# Patient Record
Sex: Male | Born: 1985 | Hispanic: Yes | Marital: Single | Smoking: Current every day smoker
Health system: Southern US, Community
[De-identification: ages and names within clinical notes are randomized; demographics above are authoritative.]

---
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
## 2020-07-04 ENCOUNTER — Other Ambulatory Visit: Payer: Self-pay

## 2020-07-04 ENCOUNTER — Emergency Department (HOSPITAL_BASED_OUTPATIENT_CLINIC_OR_DEPARTMENT_OTHER): Payer: Self-pay

## 2020-07-04 ENCOUNTER — Emergency Department (HOSPITAL_BASED_OUTPATIENT_CLINIC_OR_DEPARTMENT_OTHER)
Admission: EM | Admit: 2020-07-04 | Discharge: 2020-07-04 | Disposition: A | Discharge status: Home / Self Care | Payer: Self-pay | Attending: Emergency Medicine | Admitting: Emergency Medicine

## 2020-07-04 ENCOUNTER — Other Ambulatory Visit (HOSPITAL_BASED_OUTPATIENT_CLINIC_OR_DEPARTMENT_OTHER): Payer: Self-pay

## 2020-07-04 ENCOUNTER — Encounter (HOSPITAL_BASED_OUTPATIENT_CLINIC_OR_DEPARTMENT_OTHER): Payer: Self-pay

## 2020-07-04 DIAGNOSIS — R059 Cough, unspecified: Secondary | ICD-10-CM

## 2020-07-04 DIAGNOSIS — F17210 Nicotine dependence, cigarettes, uncomplicated: Secondary | ICD-10-CM | POA: Insufficient documentation

## 2020-07-04 DIAGNOSIS — R042 Hemoptysis: Secondary | ICD-10-CM | POA: Insufficient documentation

## 2020-07-04 DIAGNOSIS — S8012XA Contusion of left lower leg, initial encounter: Secondary | ICD-10-CM | POA: Insufficient documentation

## 2020-07-04 DIAGNOSIS — X58XXXA Exposure to other specified factors, initial encounter: Secondary | ICD-10-CM | POA: Insufficient documentation

## 2020-07-04 DIAGNOSIS — Z20822 Contact with and (suspected) exposure to covid-19: Secondary | ICD-10-CM | POA: Insufficient documentation

## 2020-07-04 DIAGNOSIS — S5011XA Contusion of right forearm, initial encounter: Secondary | ICD-10-CM | POA: Insufficient documentation

## 2020-07-04 DIAGNOSIS — D539 Nutritional anemia, unspecified: Secondary | ICD-10-CM | POA: Insufficient documentation

## 2020-07-04 DIAGNOSIS — R040 Epistaxis: Secondary | ICD-10-CM | POA: Insufficient documentation

## 2020-07-04 LAB — CBC WITH DIFFERENTIAL/PLATELET
Abs Immature Granulocytes: 0.05 10*3/uL (ref 0.00–0.07)
Basophils Absolute: 0.2 10*3/uL — ABNORMAL HIGH (ref 0.0–0.1)
Basophils Relative: 1 %
Eosinophils Absolute: 0.4 10*3/uL (ref 0.0–0.5)
Eosinophils Relative: 3 %
HCT: 35.8 % — ABNORMAL LOW (ref 39.0–52.0)
Hemoglobin: 11.8 g/dL — ABNORMAL LOW (ref 13.0–17.0)
Immature Granulocytes: 1 %
Lymphocytes Relative: 27 %
Lymphs Abs: 2.8 10*3/uL (ref 0.7–4.0)
MCH: 34.5 pg — ABNORMAL HIGH (ref 26.0–34.0)
MCHC: 33 g/dL (ref 30.0–36.0)
MCV: 104.7 fL — ABNORMAL HIGH (ref 80.0–100.0)
Monocytes Absolute: 1.1 10*3/uL — ABNORMAL HIGH (ref 0.1–1.0)
Monocytes Relative: 11 %
Neutro Abs: 6 10*3/uL (ref 1.7–7.7)
Neutrophils Relative %: 57 %
Platelets: 210 10*3/uL (ref 150–400)
RBC: 3.42 MIL/uL — ABNORMAL LOW (ref 4.22–5.81)
RDW: 14.6 % (ref 11.5–15.5)
WBC: 10.4 10*3/uL (ref 4.0–10.5)
nRBC: 0 % (ref 0.0–0.2)

## 2020-07-04 LAB — PROTIME-INR
INR: 1.1 (ref 0.8–1.2)
Prothrombin Time: 14 seconds (ref 11.4–15.2)

## 2020-07-04 LAB — COMPREHENSIVE METABOLIC PANEL
ALT: 64 U/L — ABNORMAL HIGH (ref 0–44)
AST: 220 U/L — ABNORMAL HIGH (ref 15–41)
Albumin: 3.6 g/dL (ref 3.5–5.0)
Alkaline Phosphatase: 118 U/L (ref 38–126)
Anion gap: 13 (ref 5–15)
BUN: 6 mg/dL (ref 6–20)
CO2: 20 mmol/L — ABNORMAL LOW (ref 22–32)
Calcium: 9.2 mg/dL (ref 8.9–10.3)
Chloride: 103 mmol/L (ref 98–111)
Creatinine, Ser: 0.92 mg/dL (ref 0.61–1.24)
GFR, Estimated: >60 mL/min (ref >60)
Glucose, Bld: 184 mg/dL — ABNORMAL HIGH (ref 70–99)
Potassium: 4 mmol/L (ref 3.5–5.1)
Sodium: 136 mmol/L (ref 135–145)
Total Bilirubin: 1.3 mg/dL — ABNORMAL HIGH (ref 0.3–1.2)
Total Protein: 9 g/dL — ABNORMAL HIGH (ref 6.5–8.1)

## 2020-07-04 LAB — URINALYSIS, MICROSCOPIC (REFLEX)

## 2020-07-04 LAB — RESP PANEL BY RT-PCR (FLU A&B, COVID) ARPGX2
Influenza A by PCR: NEGATIVE
Influenza B by PCR: NEGATIVE
SARS Coronavirus 2 by RT PCR: NEGATIVE

## 2020-07-04 LAB — TROPONIN I (HIGH SENSITIVITY): Troponin I (High Sensitivity): 10 ng/L (ref <18)

## 2020-07-04 LAB — URINALYSIS, ROUTINE W REFLEX MICROSCOPIC
Glucose, UA: NEGATIVE mg/dL
Hgb urine dipstick: NEGATIVE
Ketones, ur: 15 mg/dL — AB
Nitrite: NEGATIVE
Protein, ur: 30 mg/dL — AB
Specific Gravity, Urine: >1.03 — ABNORMAL HIGH (ref 1.005–1.030)
pH: 5.5 (ref 5.0–8.0)

## 2020-07-04 LAB — D-DIMER, QUANTITATIVE: D-Dimer, Quant: 0.68 ug/mL-FEU — ABNORMAL HIGH (ref 0.00–0.50)

## 2020-07-04 MED ORDER — MORPHINE SULFATE (PF) 4 MG/ML IV SOLN
4.0000 mg | Freq: Once | INTRAVENOUS | Status: AC
  Administered 2020-07-04: 4 mg via INTRAVENOUS
  Filled 2020-07-04: qty 1

## 2020-07-04 MED ORDER — DOXYCYCLINE HYCLATE 100 MG PO CAPS
100.0000 mg | ORAL_CAPSULE | Freq: Two times a day (BID) | ORAL | 0 refills | Status: AC
  Filled 2020-07-04: qty 14, 7d supply, fill #0

## 2020-07-04 MED ORDER — IOHEXOL 350 MG/ML SOLN
100.0000 mL | Freq: Once | INTRAVENOUS | Status: AC | PRN
  Administered 2020-07-04: 100 mL via INTRAVENOUS

## 2020-07-04 MED ORDER — SODIUM CHLORIDE 0.9 % IV BOLUS
1000.0000 mL | Freq: Once | INTRAVENOUS | Status: AC
  Administered 2020-07-04: 1000 mL via INTRAVENOUS

## 2020-07-04 NOTE — ED Triage Notes (Signed)
Pt c/o hemoptysis x 3 days-also c/o "bump then bruising" area to bilat LE with left worse than right-NAD-steady gait

## 2020-07-04 NOTE — ED Provider Notes (Signed)
[facility] Provider Note   CSN: [id number] Arrival date & time: 07/04/20  1444     History Chief Complaint  Patient presents with  . Hemoptysis    Cory Bishop is a 35 y.o. male.  HPI   Patient with no significant medical history presents to the emergency department with chief complaint of abnormal bleeding.  He endorses that over the last 2 weeks he has noted increased nosebleeds, bruising and coughing up blood.  Patient states that he has been having frequent nosebleeds, states that he is able to stop them, denies recent trauma in the area, has never had these in the past.  He also noted that he has been having abnormal bruising, states when he  wake up in the morning with new bruises denies trauma to the area.  Patient also states that coughs up blood states its mixed with mucus, he denies systemic infection like fevers, chills, nasal congestion, general body aches, he denies recent sick contacts, is not immunocompromise, denies recent trauma, is up-to-date on his childhood vaccines.  Patient has no history of bleeding disorders, states he has never been diagnosed with cirrhosis or varices, does admit that he drinks 3 24 ounce cans of beers daily, he denies history of stomach ulcers, denies NSAID use, denies significant abdominal history.  He denies seeing any blood in his stool or urine. patient denies alleviating factors.  Patient denies headaches, fevers, chills, chest pain, abdominal pain, nausea, vomit, diarrhea, worsening pedal edema.  History reviewed. No pertinent past medical history.  There are no problems to display for this patient.   History reviewed. No pertinent surgical history.     No family history on file.  Social History   Tobacco Use  . Smoking status: Current Every Day Smoker    Types: Cigarettes  . Smokeless tobacco: Never Used  Vaping Use  . Vaping Use: Never used  Substance Use Topics  . Alcohol use: Yes     Comment: occ  . Drug use: Yes    Types: Marijuana    Home Medications Prior to Admission medications   Medication Sig Start Date End Date Taking? Authorizing Provider  doxycycline (VIBRAMYCIN) 100 MG capsule Take 1 capsule (100 mg total) by mouth 2 (two) times daily for 7 days. 07/04/20 07/11/20 Yes Marcello Fennel, PA-C    Allergies    Nickel  Review of Systems   Review of Systems  Constitutional: Negative for chills and fever.  HENT: Positive for nosebleeds. Negative for congestion and sore throat.   Respiratory: Positive for cough. Negative for shortness of breath.   Cardiovascular: Negative for chest pain.  Gastrointestinal: Negative for abdominal pain, diarrhea, nausea and vomiting.  Genitourinary: Negative for enuresis and flank pain.  Musculoskeletal: Negative for back pain.  Skin: Negative for rash.  Neurological: Negative for dizziness and headaches.  Hematological: Bruises/bleeds easily.    Physical Exam Updated Vital Signs BP 119/69 (BP Location: Left Arm)   Pulse (!) 101   Temp 98.5 F (36.9 C) (Oral)   Resp 20   SpO2 96%   Physical Exam Vitals and nursing note reviewed.  Constitutional:      General: He is not in acute distress.    Appearance: He is not ill-appearing.  HENT:     Head: Normocephalic and atraumatic.     Nose: No congestion.  Eyes:     Conjunctiva/sclera: Conjunctivae normal.  Cardiovascular:     Rate and Rhythm: Normal rate and regular rhythm.  Pulses: Normal pulses.     Heart sounds: No murmur heard. No friction rub. No gallop.   Pulmonary:     Effort: No respiratory distress.     Breath sounds: No wheezing, rhonchi or rales.  Abdominal:     Palpations: Abdomen is soft.     Tenderness: There is no abdominal tenderness. There is no right CVA tenderness or left CVA tenderness.  Musculoskeletal:     Right lower leg: No edema.     Left lower leg: No edema.     Comments: Patient's lower extremities were visualized there is no  noted unilateral leg swelling, patient has full range of motion 5 /5 strength in his lower extremities, neurovascular intact.  He has noted tenderness along his left calf. Positive straight leg raise on the left side.  Spine was palpated it is nontender to palpation, no step-off deformities present, he has notably tender within the musculature surrounding the left side of his sacrum    Skin:    General: Skin is warm and dry.     Findings: Bruising present.     Comments: Bruising noted on patient's left lower leg on the anterior medial aspect, he also has bruising on his right upper forearm.  Neurological:     Mental Status: He is alert.  Psychiatric:        Mood and Affect: Mood normal.     ED Results / Procedures / Treatments   Labs (all labs ordered are listed, but only abnormal results are displayed) Labs Reviewed  COMPREHENSIVE METABOLIC PANEL - Abnormal; Notable for the following components:      Result Value   CO2 20 (*)    Glucose, Bld 184 (*)    Total Protein 9.0 (*)    AST 220 (*)    ALT 64 (*)    Total Bilirubin 1.3 (*)    All other components within normal limits  CBC WITH DIFFERENTIAL/PLATELET - Abnormal; Notable for the following components:   RBC 3.42 (*)    Hemoglobin 11.8 (*)    HCT 35.8 (*)    MCV 104.7 (*)    MCH 34.5 (*)    Monocytes Absolute 1.1 (*)    Basophils Absolute 0.2 (*)    All other components within normal limits  D-DIMER, QUANTITATIVE - Abnormal; Notable for the following components:   D-Dimer, Quant 0.68 (*)    All other components within normal limits  URINALYSIS, ROUTINE W REFLEX MICROSCOPIC - Abnormal; Notable for the following components:   Specific Gravity, Urine >1.030 (*)    Bilirubin Urine MODERATE (*)    Ketones, ur 15 (*)    Protein, ur 30 (*)    Leukocytes,Ua TRACE (*)    All other components within normal limits  URINALYSIS, MICROSCOPIC (REFLEX) - Abnormal; Notable for the following components:   Bacteria, UA RARE (*)     All other components within normal limits  RESP PANEL BY RT-PCR (FLU A&B, COVID) ARPGX2  PROTIME-INR  TROPONIN I (HIGH SENSITIVITY)    EKG None  Radiology CT Angio Chest PE W and/or Wo Contrast  Result Date: 07/04/2020 CLINICAL DATA:  Hemoptysis EXAM: CT ANGIOGRAPHY CHEST WITH CONTRAST TECHNIQUE: Multidetector CT imaging of the chest was performed using the standard protocol during bolus administration of intravenous contrast. Multiplanar CT image reconstructions and MIPs were obtained to evaluate the vascular anatomy. CONTRAST:  167m OMNIPAQUE IOHEXOL 350 MG/ML SOLN COMPARISON:  Chest x-ray 07/04/2020 FINDINGS: Cardiovascular: Satisfactory opacification of the pulmonary arteries to the segmental  level. No evidence of pulmonary embolism. Nonaneurysmal aorta. Normal cardiac size. No pericardial effusion Mediastinum/Nodes: No enlarged mediastinal, hilar, or axillary lymph nodes. Thyroid gland, trachea, and esophagus demonstrate no significant findings. Lungs/Pleura: No pleural effusion or pneumothorax. Patchy perihilar and lower lobe foci of ground-glass density. Upper Abdomen: Hepatic steatosis.  No acute abnormality Musculoskeletal: No chest wall abnormality. No acute or significant osseous findings. Review of the MIP images confirms the above findings. IMPRESSION: 1. Negative for acute pulmonary embolus. 2. Patchy bilateral perihilar and lower lobe foci of ground-glass density, indeterminate for foci of pneumonia to include atypical or viral process, versus mild foci of pulmonary hemorrhage. 3. Hepatic steatosis. Electronically Signed   By: Donavan Foil M.D.   On: 07/04/2020 17:00   US Abdomen Limited  Result Date: 07/04/2020 CLINICAL DATA:  Elevated liver enzymes EXAM: ULTRASOUND ABDOMEN LIMITED RIGHT UPPER QUADRANT COMPARISON:  None. FINDINGS: Gallbladder: No gallstones or wall thickening visualized. No sonographic Murphy sign noted by sonographer. Common bile duct: Diameter: Normal caliber, 3  mm Liver: Increased echotexture compatible with fatty infiltration. No focal abnormality or biliary ductal dilatation. Liver is prominent measuring 21 cm. Portal vein is patent on color Doppler imaging with normal direction of blood flow towards the liver. Other: None. IMPRESSION: Hepatic steatosis.  Hepatomegaly. Electronically Signed   By: Rolm Baptise M.D.   On: 07/04/2020 17:09   US Venous Img Lower Unilateral Left  Result Date: 07/04/2020 CLINICAL DATA:  Shortness of breath. EXAM: LEFT LOWER EXTREMITY VENOUS DOPPLER ULTRASOUND TECHNIQUE: Gray-scale sonography with compression, as well as color and duplex ultrasound, were performed to evaluate the deep venous system(s) from the level of the common femoral vein through the popliteal and proximal calf veins. COMPARISON:  None. FINDINGS: VENOUS Normal compressibility of the common femoral, superficial femoral, and popliteal veins, as well as the visualized calf veins. Visualized portions of profunda femoral vein and great saphenous vein unremarkable. No filling defects to suggest DVT on grayscale or color Doppler imaging. Doppler waveforms show normal direction of venous flow, normal respiratory plasticity and response to augmentation. Limited views of the contralateral common femoral vein are unremarkable. IMPRESSION: No evidence of DVT. Electronically Signed   By: Margaretha Sheffield MD   On: 07/04/2020 16:13   DG Chest Port 1 View  Result Date: 07/04/2020 CLINICAL DATA:  Shortness of breath for 3 days.  Hemoptysis. EXAM: PORTABLE CHEST 1 VIEW COMPARISON:  None. FINDINGS: The heart size and mediastinal contours are within normal limits. Both lungs are clear. The visualized skeletal structures are unremarkable. IMPRESSION: No active disease. Electronically Signed   By: Marlaine Hind M.D.   On: 07/04/2020 15:54    Procedures Procedures   Medications Ordered in ED Medications  sodium chloride 0.9 % bolus 1,000 mL (1,000 mLs Intravenous New Bag/Given  07/04/20 1645)  morphine 4 MG/ML injection 4 mg (4 mg Intravenous Given 07/04/20 1645)  iohexol (OMNIPAQUE) 350 MG/ML injection 100 mL (100 mLs Intravenous Contrast Given 07/04/20 1625)    ED Course  I have reviewed the triage vital signs and the nursing notes.  Pertinent labs & imaging results that were available during my care of the patient were reviewed by me and considered in my medical decision making (see chart for details).    MDM Rules/Calculators/A&P                          Initial impression-patient presents with abnormal bruising.  He is alert, does not appear acute  distress, vital signs show tachycardia. Will obtain basic lab work-up, obtain chest x-ray, DVT side and reassess  Work-up-CBC shows macrocytic anemia with a hemoglobin 11.8.  CMP shows decreased CO2 20, hyperglycemia 184, elevated AST 220, ALT 64, T bili 1.3 D-dimer elevated 0.68, UA shows bilirubin, ketones, proteins, trace leukocytes, rare bacteria. prothrombin time 14, INR 1.1, troponin 10.  No evidence of DVT noted, chest x-ray unremarkable.  CT of chest reveals patchy bilateral perihilar and lower lobe foci of ground glass densities differential includes pneumonia versus pulmonary hemorrhage.  Limited abdominal ultrasound reveals hepatic steatosis.   Reassessment D-dimer is elevated with an elevated heart rate and hemoptysis concern for possible PE will send patient for CTA of chest for further evaluation.  Patient also has noted elevated liver enzymes suspect this is secondary due to alcohol consumption will obtain limited ultrasound to exclude structural abnormalities  Patient was reassessed after fluids and morphine, states he is feeling much better, vital signs of improved. Patient was updated on lab work and imaging, patient agreed for discharge at this time.   Rule out- I have low suspicion for ACS as history is atypical, patient has no cardiac history, EKG was sinus rhythm without signs of ischemia, patient's  first troponin is 10, will defer second troponin as patient has been chest pain-free for greater than 12 hours, would expect elevation in troponin if ACS was present.  Low suspicion for PE as CTA of the chest is negative.  Low suspicion for DVT as study is negative. low suspicion for AAA or aortic dissection as history is atypical, patient has low risk factors.  I have low suspicion for perforated stomach ulcer as abdomen is nondistended, dull to percussion, no peritoneal signs on exam.  Low suspicion for ITP as there no pancytopenia seen on CBC, no gross abnormal bleeding seen on exam, platelets within normal limits.  Low suspicion for gallbladder abnormality as patient does not have right upper quadrant pain, no alk phos noted on CMP.  Patient does have noted elevated liver enzymes with a increased T bili, I suspect this is secondary due to alcohol consumption as patient endorses frequent alcohol use, this would also explain his macrocytic anemia.  Low suspicion for cirrhosis as there is no jaundice or coagulopathy noted, limited ultrasound negative for cirrhosis.  low suspicion for systemic infection as patient is nontoxic-appearing, vital signs reassuring, no obvious source infection noted on exam.  Patient is noted to be tachycardic on my exam but this improved after fluids, I suspect he was dehydrated as his urine was extremely concentrated.    Plan-  1. abnormal bleeding-I suspect is secondary due to alcohol consumption as this can cause abnormal bleeding, will recommend patient limit the amount of alcohol he consumes and follows up with PCP for further evaluation.  2. Patient CTA does show possible infiltrates blood versus bacterial,  will start patient on antibiotics to cover for possible infection and follow-up with PCP for further evaluation  Vital signs have remained stable, no indication for hospital admission.  Patient discussed with attending and they agreed with assessment and plan.  Patient  given at home care as well strict return precautions.  Patient verbalized that they understood agreed to said plan.   Final Clinical Impression(s) / ED Diagnoses Final diagnoses:  Macrocytic anemia  Cough    Rx / DC Orders ED Discharge Orders         Ordered    doxycycline (VIBRAMYCIN) 100 MG capsule  2 times daily  07/04/20 1736           Marcello Fennel, PA-C 07/04/20 1737    Quintella Reichert, MD 07/04/20 1743

## 2020-07-04 NOTE — Discharge Instructions (Addendum)
You have elevated liver enzymes and macrocytic anemia which I suspect is from alcohol use.  I would like you to limit the amount of alcohol you consume, please not stop completely too quickly as this will cause a bad withdrawal.  given you information for resources for outpatient counseling. Like you to follow-up with a PCP for further evaluation given contact information above please call to schedule an appointment  Your chest x-ray reveals possible pneumonia started you on antibiotics please take as prescribed.  Like you to follow-up with a PCP for further evaluation given contact information above please call to schedule an appointment  Come back to the emergency department if you develop chest pain, shortness of breath, severe abdominal pain, uncontrolled nausea, vomiting, diarrhea.

## 2022-04-22 IMAGING — DX DG CHEST 1V PORT
1 series · 1 of 1 positions shown · non-contrast
Comparison: None.

CLINICAL DATA: Shortness of breath for 3 days.  Hemoptysis.

EXAM:
PORTABLE CHEST 1 VIEW

[chest ap]
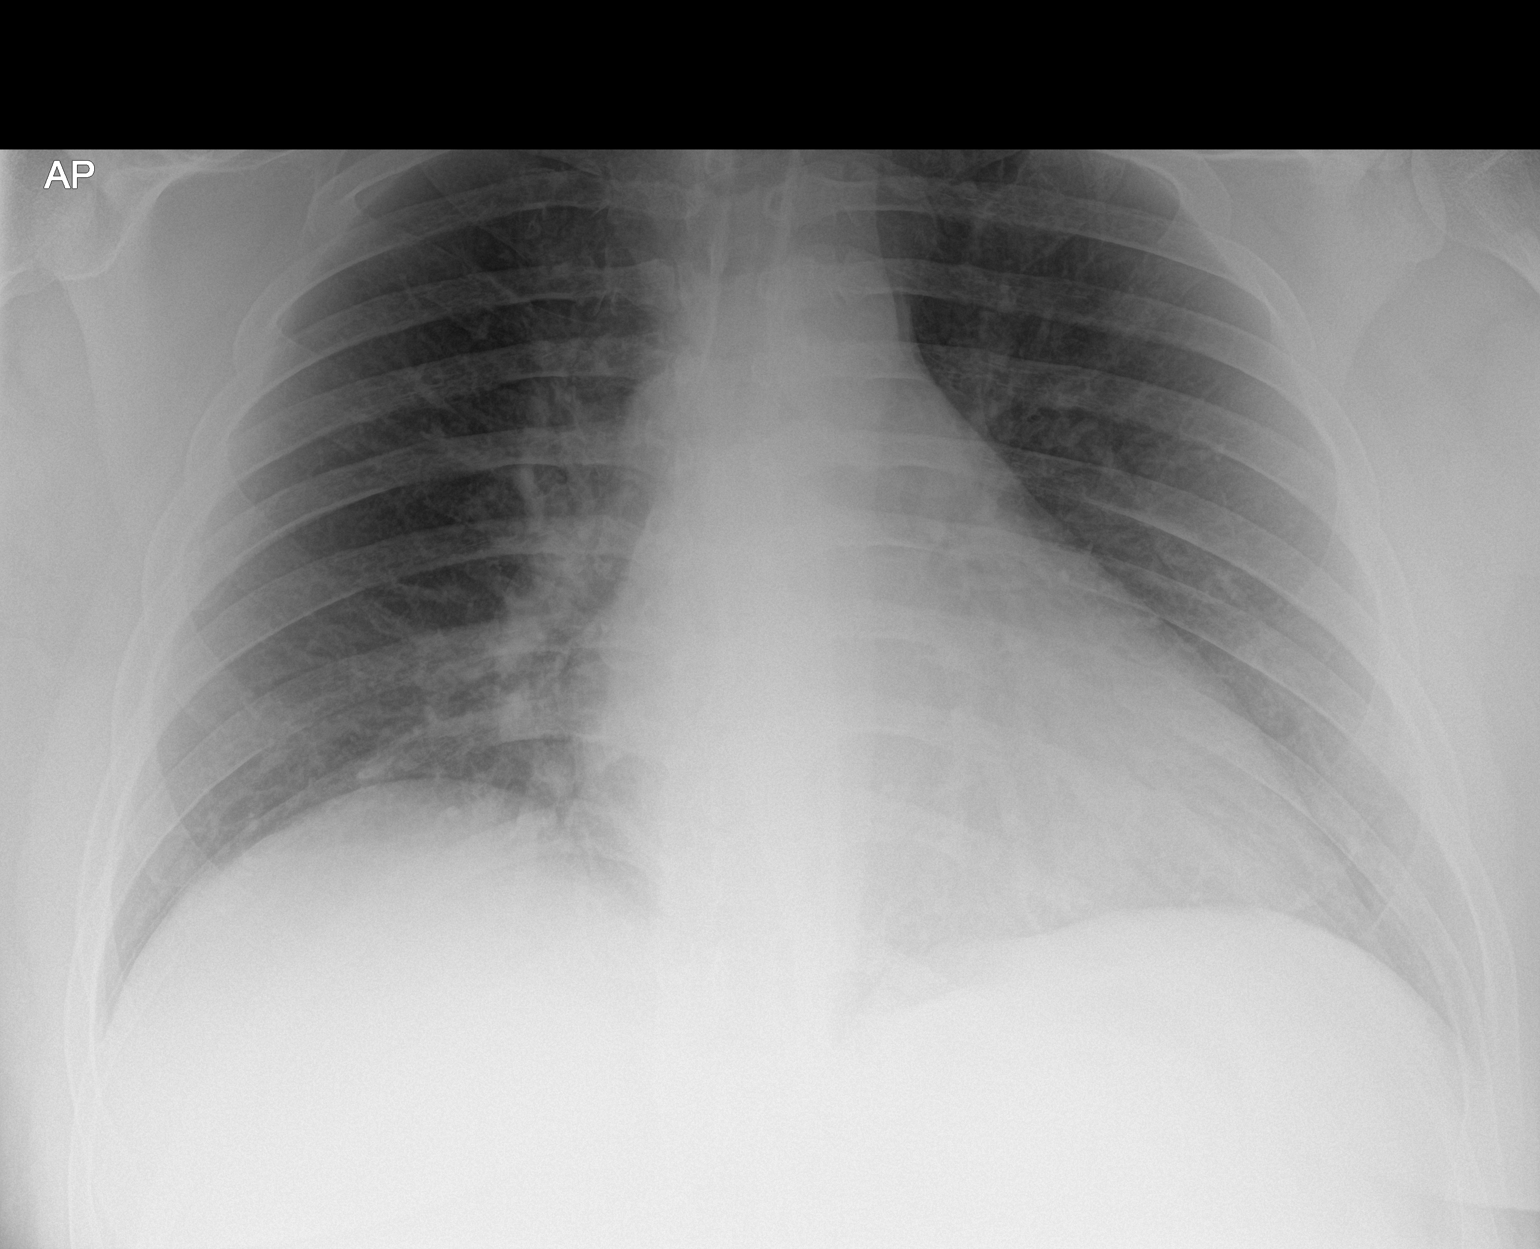

[1 of 1 positions shown; findings below may reference images not displayed]

FINDINGS: The heart size and mediastinal contours are within normal limits.
Both lungs are clear. The visualized skeletal structures are
unremarkable.
IMPRESSION: No active disease.

## 2022-04-22 IMAGING — US US ABDOMEN LIMITED RUQ/ASCITES
1 series · 14 of 25 positions shown · non-contrast
Comparison: None.

CLINICAL DATA: Elevated liver enzymes

EXAM:
ULTRASOUND ABDOMEN LIMITED RIGHT UPPER QUADRANT

[Series 1: us abdomen limited ruq/ascites · 14 of 44 slices shown]
[im 1/44]
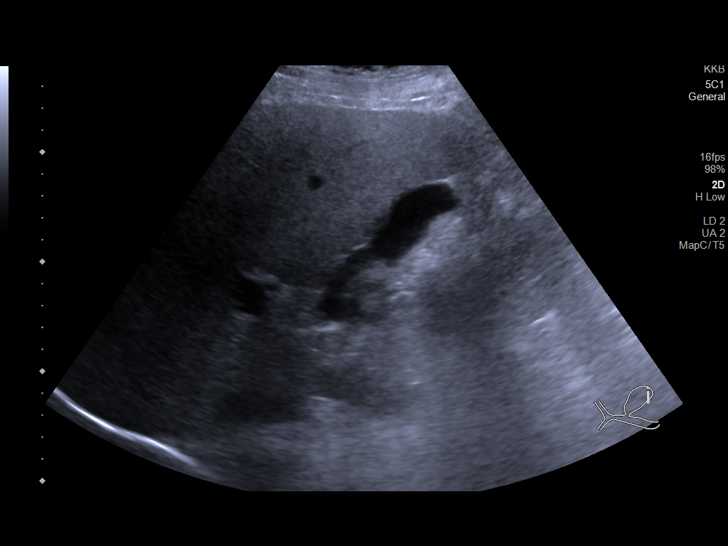
[im 4/44]
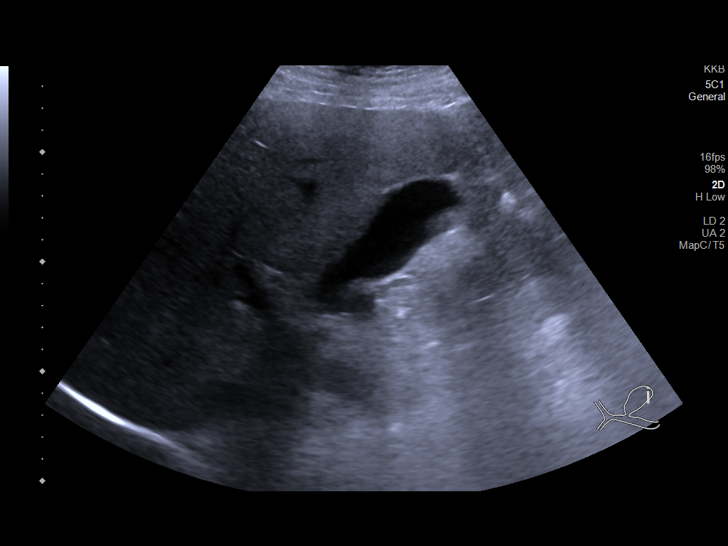
[im 8/44]
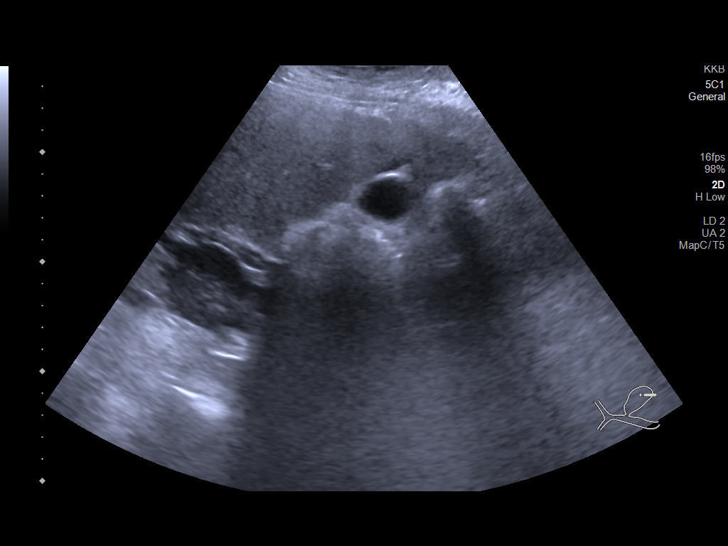
[im 11/44]
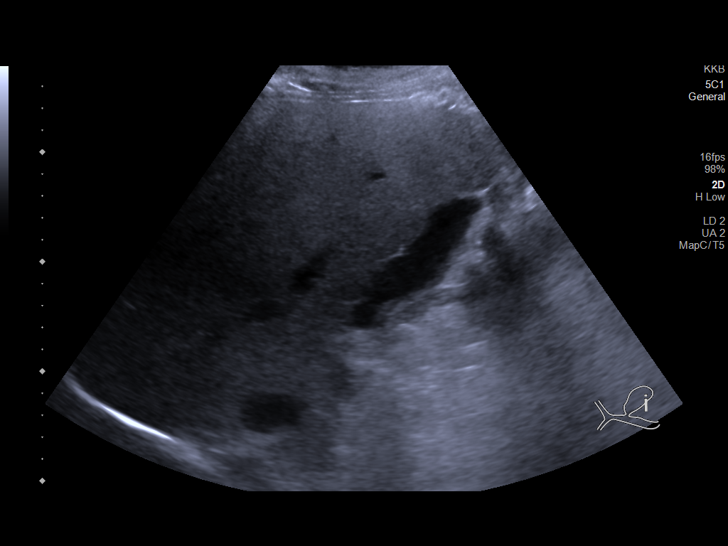
[im 15/44]
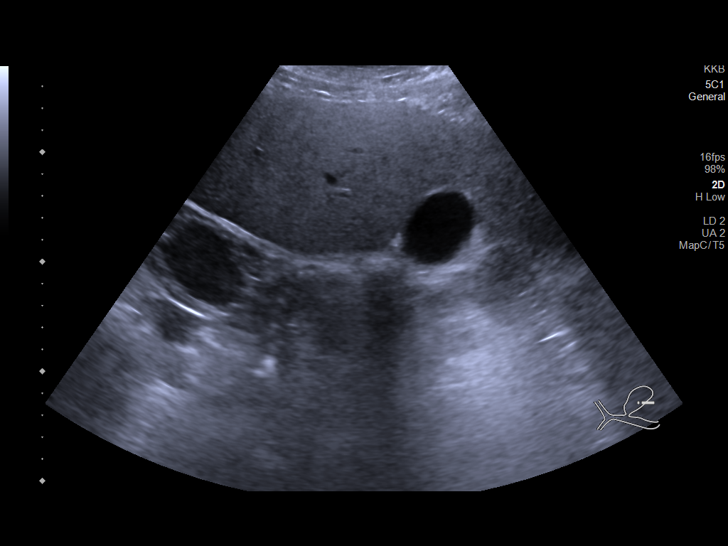
[im 17/44]
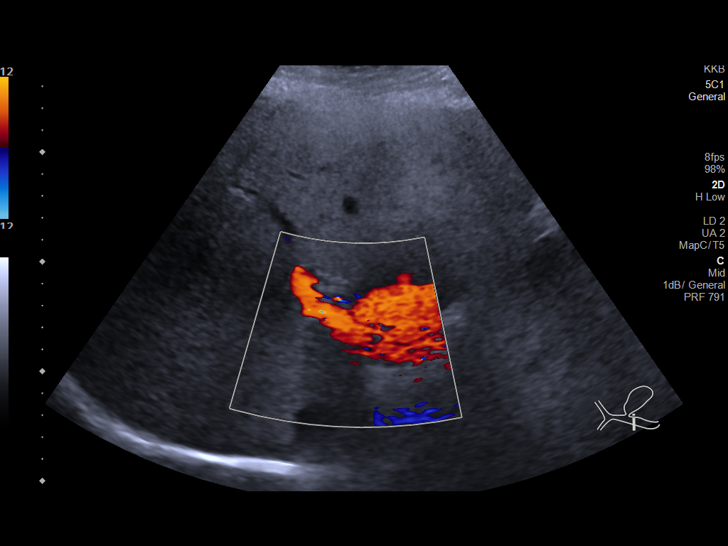
[im 20/44]
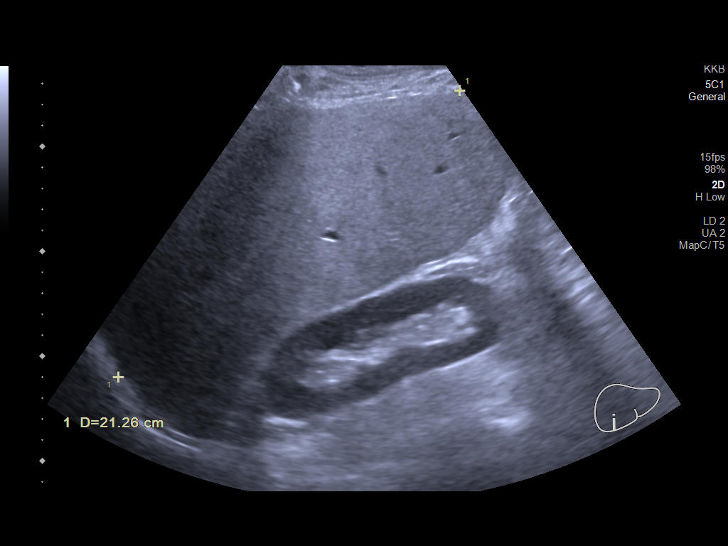
[im 24/44]
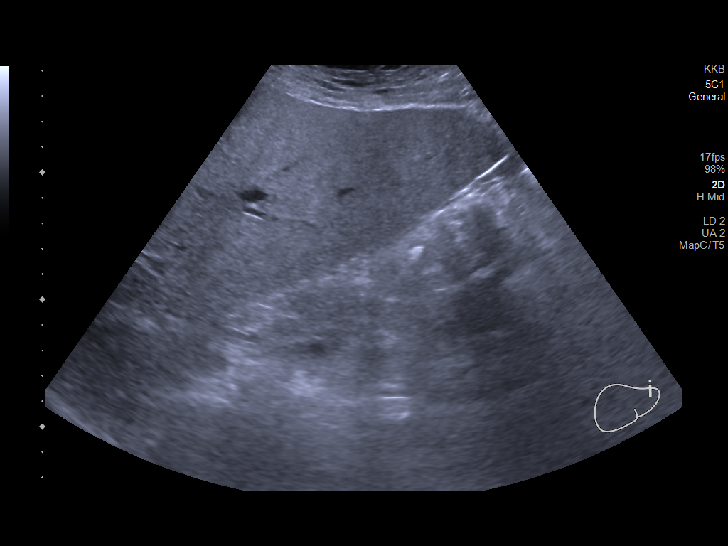
[im 27/44]
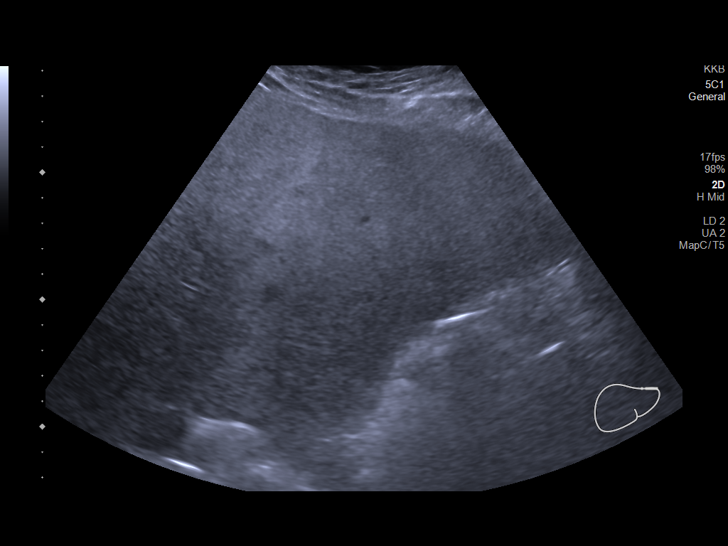
[im 29/44]
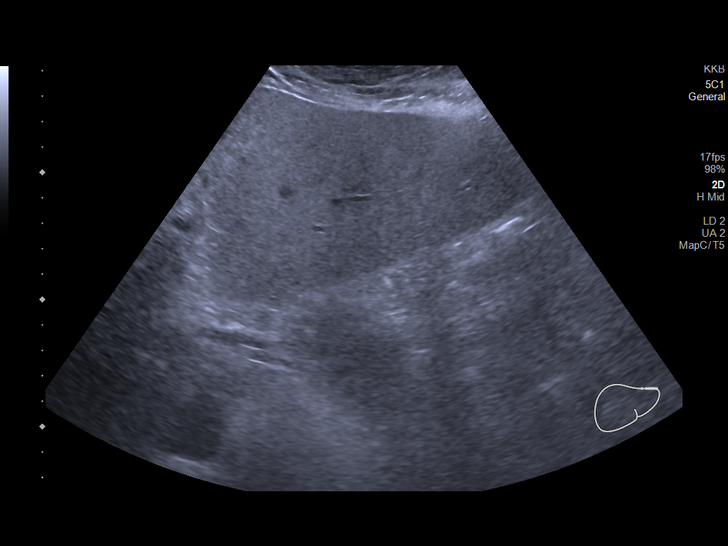
[im 33/44]
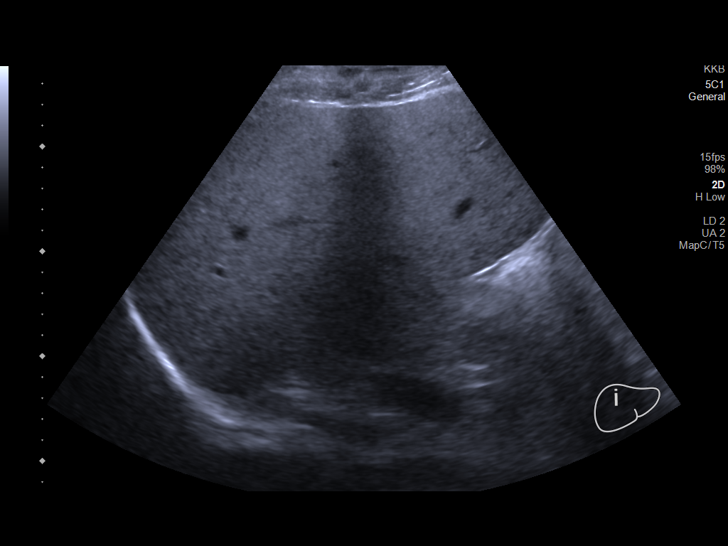
[im 36/44]
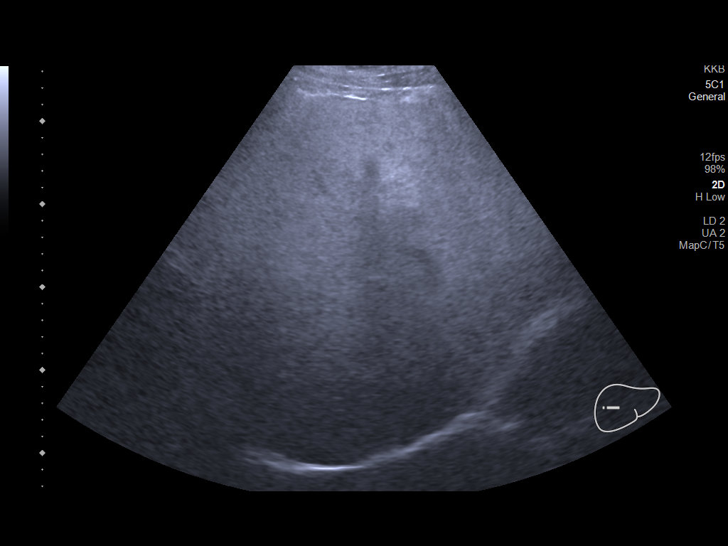
[im 40/44]
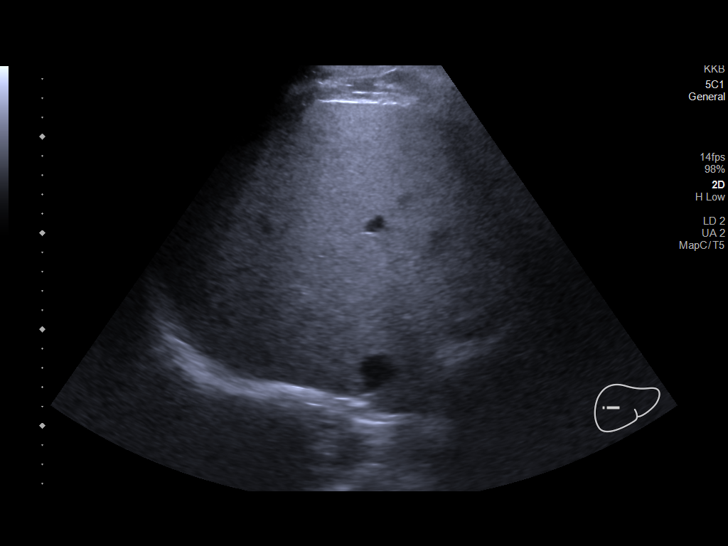
[im 44/44]
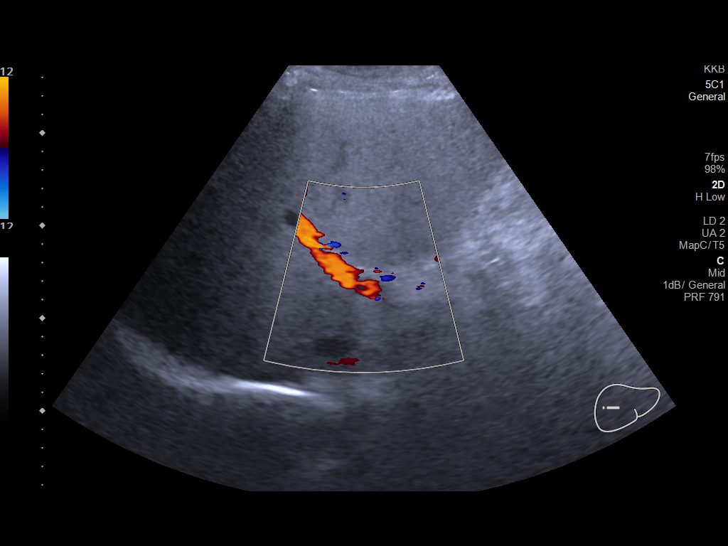

[14 of 25 positions shown; findings below may reference images not displayed]

FINDINGS: Gallbladder:

No gallstones or wall thickening visualized. No sonographic Murphy
sign noted by sonographer.

Common bile duct:

Diameter: Normal caliber, 3 mm

Liver:

Increased echotexture compatible with fatty infiltration. No focal
abnormality or biliary ductal dilatation. Liver is prominent
measuring 21 cm. Portal vein is patent on color Doppler imaging with
normal direction of blood flow towards the liver.

Other: None.
IMPRESSION: Hepatic steatosis.  Hepatomegaly.
